# Patient Record
Sex: Female | Born: 1970 | Race: White | Hispanic: No | Marital: Married | Smoking: Never smoker
Health system: Southern US, Community
[De-identification: ages and names within clinical notes are randomized; demographics above are authoritative.]

## PROBLEM LIST (undated history)

## (undated) DIAGNOSIS — C439 Malignant melanoma of skin, unspecified: Secondary | ICD-10-CM

## (undated) DIAGNOSIS — C801 Malignant (primary) neoplasm, unspecified: Secondary | ICD-10-CM

## (undated) HISTORY — DX: Malignant melanoma of skin, unspecified: C43.9

## (undated) HISTORY — PX: WISDOM TOOTH EXTRACTION: SHX21

## (undated) HISTORY — DX: Malignant (primary) neoplasm, unspecified: C80.1

## (undated) HISTORY — PX: DILATION AND CURETTAGE OF UTERUS: SHX78

## (undated) HISTORY — PX: OTHER SURGICAL HISTORY: SHX169

---

## 1998-01-08 ENCOUNTER — Other Ambulatory Visit: Admission: RE | Admit: 1998-01-08 | Discharge: 1998-01-08 | Payer: Self-pay | Admitting: Obstetrics and Gynecology

## 1999-08-24 ENCOUNTER — Other Ambulatory Visit: Admission: RE | Admit: 1999-08-24 | Discharge: 1999-08-24 | Payer: Self-pay | Admitting: Obstetrics and Gynecology

## 2001-01-15 ENCOUNTER — Other Ambulatory Visit: Admission: RE | Admit: 2001-01-15 | Discharge: 2001-01-15 | Payer: Self-pay | Admitting: Obstetrics and Gynecology

## 2002-04-12 ENCOUNTER — Other Ambulatory Visit: Admission: RE | Admit: 2002-04-12 | Discharge: 2002-04-12 | Payer: Self-pay | Admitting: Obstetrics and Gynecology

## 2003-07-02 ENCOUNTER — Other Ambulatory Visit: Admission: RE | Admit: 2003-07-02 | Discharge: 2003-07-02 | Payer: Self-pay | Admitting: Obstetrics and Gynecology

## 2004-04-01 ENCOUNTER — Other Ambulatory Visit: Admission: RE | Admit: 2004-04-01 | Discharge: 2004-04-01 | Payer: Self-pay | Admitting: Obstetrics and Gynecology

## 2004-10-15 ENCOUNTER — Inpatient Hospital Stay (HOSPITAL_COMMUNITY): Admission: AD | Admit: 2004-10-15 | Discharge: 2004-10-17 | Payer: Self-pay | Admitting: Obstetrics and Gynecology

## 2004-11-25 ENCOUNTER — Other Ambulatory Visit: Admission: RE | Admit: 2004-11-25 | Discharge: 2004-11-25 | Payer: Self-pay | Admitting: Obstetrics and Gynecology

## 2008-07-02 ENCOUNTER — Encounter (INDEPENDENT_AMBULATORY_CARE_PROVIDER_SITE_OTHER): Payer: Self-pay | Admitting: Obstetrics and Gynecology

## 2008-07-02 ENCOUNTER — Ambulatory Visit (HOSPITAL_COMMUNITY): Admission: RE | Admit: 2008-07-02 | Discharge: 2008-07-02 | Payer: Self-pay | Admitting: Obstetrics and Gynecology

## 2009-10-20 ENCOUNTER — Inpatient Hospital Stay (HOSPITAL_COMMUNITY): Admission: AD | Admit: 2009-10-20 | Discharge: 2009-10-22 | Payer: Self-pay | Admitting: Obstetrics and Gynecology

## 2010-06-27 LAB — CBC
HCT: 32.3 % — ABNORMAL LOW (ref 36.0–46.0)
HCT: 37.2 % (ref 36.0–46.0)
Hemoglobin: 11.1 g/dL — ABNORMAL LOW (ref 12.0–15.0)
Hemoglobin: 12.5 g/dL (ref 12.0–15.0)
MCH: 30.5 pg (ref 26.0–34.0)
MCHC: 34.4 g/dL (ref 30.0–36.0)
MCV: 91.5 fL (ref 78.0–100.0)
RBC: 4.08 MIL/uL (ref 3.87–5.11)

## 2010-06-27 LAB — RPR: RPR Ser Ql: NONREACTIVE

## 2010-07-22 LAB — CBC
Hemoglobin: 12.7 g/dL (ref 12.0–15.0)
MCHC: 32.9 g/dL (ref 30.0–36.0)
MCV: 93.1 fL (ref 78.0–100.0)
RBC: 4.15 MIL/uL (ref 3.87–5.11)

## 2010-07-22 LAB — ABO/RH: ABO/RH(D): A POS

## 2010-08-24 NOTE — Op Note (Signed)
NAMEJOSSELYN, Brittany Ortega              ACCOUNT NO.:  [id number]   MEDICAL RECORD NO.:  [id number]          PATIENT TYPE:  AMB   LOCATION:  SDC                           FACILITY:  [facility]   PHYSICIAN:  Duke Salvia. Marcelle Overlie, M.D.DATE OF BIRTH:  05-Jan-1971   DATE OF PROCEDURE:  07/02/2008  DATE OF DISCHARGE:                               OPERATIVE REPORT   PREOPERATIVE DIAGNOSIS:  Missed abortion.   POSTOPERATIVE DIAGNOSIS:  Missed abortion.   PROCEDURE:  Dilatation and evacuation.   SURGEON:  Duke Salvia. Marcelle Overlie, MD   ANESTHESIA:  General.   COMPLICATIONS:  None.   DRAINS:  In-and-out Foley catheter.   BLOOD LOSS:  Minimal.   SPECIMENS REMOVED:  Products of conception.   PROCEDURE AND FINDINGS:  The patient was taken to the operating room.  After an adequate level of general anesthesia was obtained with the  patient's legs in stirrups, the lower abdomen, perineum, and vagina were  prepped and draped in usual manner for D and E.  Bladder was drained.  EUA carried out.  Uterus 8-week size, mid position.  Adnexa negative.  Speculum was positioned.  Cervix was grasped with a tenaculum.  Paracervical block was created by infiltrating at 3 and 9 o'clock  submucosally, 5-7 mL 1% Xylocaine on either side after negative  aspiration.  The uterus was then sounded to 8-9 cm, progressively  dilated to 27 Pratt.  A #7 curved suction curette was then used to  curette a moderate amount of tissue.  When no further tissue could be  removed, a small blunt curette was used to explore the cavity revealing  it to be clean.  There was minimal bleeding.  She tolerated this well,  went to recovery room in good condition.      Richard M. Marcelle Overlie, M.D.  Electronically Signed     RMH/MEDQ  D:  07/02/2008  T:  07/03/2008  Job:  [id number]

## 2010-08-24 NOTE — H&P (Signed)
NAMEMADINE, Brittany Ortega              ACCOUNT NO.:  [id number]   MEDICAL RECORD NO.:  [id number]          PATIENT TYPE:  AMB   LOCATION:                                FACILITY:  [facility]   PHYSICIAN:  Duke Salvia. Marcelle Overlie, M.D.DATE OF BIRTH:  1970-07-16   DATE OF ADMISSION:  07/02/2008  DATE OF DISCHARGE:                              HISTORY & PHYSICAL   DATE OF SCHEDULED SURGERY:  March 24.   CHIEF COMPLAINT:  Missed AB.   HISTORY OF PRESENT ILLNESS:  [age]-year-old G4, P2-0-1-2 was seen  yesterday for a new OB exam.  By good dates at 10 weeks.  Ultrasound  showed to be 7 weeks, well formed sac with a fetal pole, no fetal  movement, no FHR.  Her blood type is A+.  Presents now for D and E.  This procedure including risks related to bleeding, infection, other  complications that may require additional surgery such as perforation  were reviewed with her, which she understands and accepts.   PAST MEDICAL HISTORY:  Please see [place] form for detail.   ALLERGIES:  None.   OBSTETRICAL HISTORY:  Two vaginal deliveries at term.  December of 2009  had an early SAB that did not require D and E.   PHYSICAL EXAM:  Temperature 98.2, blood pressure 120/68.  HEENT:  Unremarkable.  NECK:  Supple without mass.  LUNGS:  Clear.  CARDIOVASCULAR:  Regular rate and rhythm without murmurs, rubs or  gallops.  BREASTS:  Without masses.  ABDOMEN:  Soft, flat, nontender.  PELVIC EXAM:  Normal external genitalia.  Vagina and cervix clear.  Uterus was 7-8 weeks' size, mid position.  Adnexa negative.  EXTREMITIES AND NEUROLOGIC:  Exam unremarkable.   IMPRESSION:  Missed abortion.   PLAN:  Dilation and evacuation procedure and risks reviewed as above.      Richard M. Marcelle Overlie, M.D.  Electronically Signed    RMH/MEDQ  D:  07/01/2008  T:  07/01/2008  Job:  [id number]

## 2010-08-27 NOTE — Op Note (Signed)
Brittany Ortega, KUSHNER              ACCOUNT NO.:  [id number]   MEDICAL RECORD NO.:  [id number]          PATIENT TYPE:  INP   LOCATION:  9143                          FACILITY:  [facility]   PHYSICIAN:  Guy Sandifer. Henderson Cloud, M.D. DATE OF BIRTH:  Jan 16, 1971   DATE OF PROCEDURE:  10/15/2004  DATE OF DISCHARGE:                                 OPERATIVE REPORT   PROCEDURE:  Vacuum extraction.   INDICATIONS AND CONSENT:  This patient is a 40 year old married white  female, G2, P8, with an EDC of October 23, 2004, who presents complaining of  spontaneous rupture of membranes for clear fluid and contractions beginning  at 10:30 a.m.  The patient has been pushing for approximately 1-1/2 to two  hours.  Vertex is at a +3 station.  The patient is tired and requests  assistance.  Vacuum extraction with one in 40,000 risk of severe morbidity  and mortality is discussed with the patient and her husband.  All questions  are answered, and the patient and husband request vacuum extraction.   PROCEDURE:  The Foley catheter had been removed less than 30 minutes prior.  The Kiwi vacuum extractor is placed and with a contraction, vacuum is  applied into the green on the handle.  Over the course of the single  contraction with a medium pull with no pop-offs, the vertex crowns up  nicely.  The vacuum extractor is removed and a second degree midline  episiotomy is performed.  The vertex then delivers.  A nuchal cord x1 is  reduced and the remainder of the baby is delivered without difficulty.  A  viable female infant, Apgars of 8 and 9 at one and five minutes, respectively,  is obtained.  Weight and arterial cord pH is pending at the time of  dictation.  Placenta is three vessels, intact.  Estimated blood loss is 400  mL.  Cervix and vagina without laceration.  A second degree midline  episiotomy is repaired.  The patient and infant are stable in the labor and  delivery room.       JET/MEDQ  D:  10/15/2004  T:   10/16/2004  Job:  [id number]

## 2011-03-30 DIAGNOSIS — Z0000 Encounter for general adult medical examination without abnormal findings: Secondary | ICD-10-CM | POA: Insufficient documentation

## 2011-11-17 ENCOUNTER — Other Ambulatory Visit: Payer: Self-pay | Admitting: Obstetrics and Gynecology

## 2011-11-17 DIAGNOSIS — Z1231 Encounter for screening mammogram for malignant neoplasm of breast: Secondary | ICD-10-CM

## 2011-12-23 ENCOUNTER — Ambulatory Visit: Payer: Self-pay

## 2012-01-20 ENCOUNTER — Ambulatory Visit
Admission: RE | Admit: 2012-01-20 | Discharge: 2012-01-20 | Disposition: A | Discharge status: Home / Self Care | Payer: BC Managed Care – PPO | Source: Ambulatory Visit | Attending: Obstetrics and Gynecology | Admitting: Obstetrics and Gynecology

## 2012-01-20 DIAGNOSIS — Z1231 Encounter for screening mammogram for malignant neoplasm of breast: Secondary | ICD-10-CM

## 2013-02-22 ENCOUNTER — Other Ambulatory Visit: Payer: Self-pay

## 2013-02-22 DIAGNOSIS — Z1231 Encounter for screening mammogram for malignant neoplasm of breast: Secondary | ICD-10-CM

## 2013-03-25 ENCOUNTER — Ambulatory Visit
Admission: RE | Admit: 2013-03-25 | Discharge: 2013-03-25 | Disposition: A | Discharge status: Home / Self Care | Payer: 59 | Source: Ambulatory Visit

## 2013-03-25 DIAGNOSIS — Z1231 Encounter for screening mammogram for malignant neoplasm of breast: Secondary | ICD-10-CM

## 2013-04-01 ENCOUNTER — Other Ambulatory Visit: Payer: Self-pay | Admitting: Obstetrics and Gynecology

## 2013-04-01 DIAGNOSIS — R928 Other abnormal and inconclusive findings on diagnostic imaging of breast: Secondary | ICD-10-CM

## 2013-04-05 ENCOUNTER — Ambulatory Visit
Admission: RE | Admit: 2013-04-05 | Discharge: 2013-04-05 | Disposition: A | Discharge status: Home / Self Care | Payer: 59 | Source: Ambulatory Visit | Attending: Obstetrics and Gynecology | Admitting: Obstetrics and Gynecology

## 2013-04-05 DIAGNOSIS — R928 Other abnormal and inconclusive findings on diagnostic imaging of breast: Secondary | ICD-10-CM

## 2014-03-25 ENCOUNTER — Other Ambulatory Visit: Payer: Self-pay | Admitting: Obstetrics and Gynecology

## 2014-03-26 LAB — CYTOLOGY - PAP

## 2014-04-14 ENCOUNTER — Other Ambulatory Visit: Payer: Self-pay

## 2014-04-14 DIAGNOSIS — Z1231 Encounter for screening mammogram for malignant neoplasm of breast: Secondary | ICD-10-CM

## 2014-04-18 ENCOUNTER — Ambulatory Visit
Admission: RE | Admit: 2014-04-18 | Discharge: 2014-04-18 | Disposition: A | Discharge status: Home / Self Care | Payer: 59 | Source: Ambulatory Visit

## 2014-04-18 DIAGNOSIS — Z1231 Encounter for screening mammogram for malignant neoplasm of breast: Secondary | ICD-10-CM

## 2014-04-28 ENCOUNTER — Ambulatory Visit (INDEPENDENT_AMBULATORY_CARE_PROVIDER_SITE_OTHER): Payer: 59 | Admitting: Internal Medicine

## 2014-04-28 ENCOUNTER — Encounter: Payer: Self-pay | Admitting: Internal Medicine

## 2014-04-28 VITALS — BP 128/72 | HR 88 | Resp 16 | Ht 61.0 in | Wt 165.0 lb

## 2014-04-28 DIAGNOSIS — R232 Flushing: Secondary | ICD-10-CM

## 2014-04-28 DIAGNOSIS — R200 Anesthesia of skin: Secondary | ICD-10-CM | POA: Insufficient documentation

## 2014-04-28 DIAGNOSIS — C439 Malignant melanoma of skin, unspecified: Secondary | ICD-10-CM

## 2014-04-28 DIAGNOSIS — R208 Other disturbances of skin sensation: Secondary | ICD-10-CM

## 2014-04-29 ENCOUNTER — Encounter: Payer: Self-pay | Admitting: *Deleted

## 2014-04-29 ENCOUNTER — Ambulatory Visit (HOSPITAL_BASED_OUTPATIENT_CLINIC_OR_DEPARTMENT_OTHER): Admission: RE | Admit: 2014-04-29 | Payer: 59 | Source: Ambulatory Visit

## 2014-04-29 NOTE — Progress Notes (Signed)
   Subjective:    Patient ID: Brittany Ortega, female    DOB: 12-11-1970, 44 y.o.   MRN: [id number]  HPI  Brittany Ortega is here for first visit primary care.  PMH of melanoma (dx left hand)  She is concerned over question of menopausal hot flushes.    She began 1 month ago with feeling of hot sensation across chest that radiated down both arms.  She has never had any diaphoresis.  Face went numb on Right side of face (happened while driving),  No  Visual or speech changes.  No muslce weakness no paresthesias.    She denies chest jpain or palpitations  No diarreha no rashes  [facility]  Imaging not done   Told labs were all  OK  No Known Allergies Past Medical History  Diagnosis Date  . Cancer     skin  . Melanoma    Past Surgical History  Procedure Laterality Date  . Wisdom tooth extraction    . Dilation and curettage of uterus     History   Social History  . Marital Status: Married    Spouse Name: N/A    Number of Children: N/A  . Years of Education: N/A   Occupational History  . Not on file.   Social History Main Topics  . Smoking status: Never Smoker   . Smokeless tobacco: Never Used  . Alcohol Use: No  . Drug Use: No  . Sexual Activity:    Partners: Male   Other Topics Concern  . Not on file   Social History Narrative  . No narrative on file   Family History  Problem Relation Age of Onset  . Breast cancer Mother   . Heart disease Maternal Grandfather   . Hypertension Paternal Grandmother   . Heart disease Paternal Grandfather    Patient Active Problem List   Diagnosis Date Noted  . Facial numbness 04/28/2014  . Melanoma of skin 04/28/2014   No current outpatient prescriptions on file prior to visit.   No current facility-administered medications on file prior to visit.      Review of Systems    see HPI Objective:   Physical Exam  Physical Exam  Nursing note and vitals reviewed.  Constitutional: She is oriented to  person, place, and time. She appears well-developed and well-nourished.  HENT:  Head: Normocephalic and atraumatic.  Cardiovascular: Normal rate and regular rhythm. Exam reveals no gallop and no friction rub.  No murmur heard.  Pulmonary/Chest: Breath sounds normal. She has no wheezes. She has no rales.  Neurological: She is alert and oriented to person, place, and time.  CN II-XII intact Reflexes 2+ symmetric Motor 5/5 UE and LE Sensory intact to microfilament Cerebellar intact FTN Skin: Skin is warm and dry.  Nor rash  Psychiatric: She has a normal mood and affect. Her behavior is normal.            Assessment & Plan:  Facial numbness with history of melanoma  Will get Head CT  EKG no acute changes  Sensation of heat  Doubt vasomotor menopausal flushing as she has never had and diaphoresis ftom this.  Will check thyroid, FSH and all labs  Follow up with me in 3-4 weeks

## 2014-05-19 ENCOUNTER — Ambulatory Visit: Payer: 59 | Admitting: Internal Medicine

## 2014-05-19 ENCOUNTER — Telehealth: Payer: Self-pay | Admitting: Internal Medicine

## 2014-05-19 NOTE — Telephone Encounter (Signed)
She cancelled the CT. She also had an appointment to follow up today for her labs however she cancelled that as well because she has not had the labs drawn yet. She said she plans to call back and reschedule-eh

## 2014-05-19 NOTE — Telephone Encounter (Signed)
Brittany Ortega  Call this pt and see if she went for her head CT that was ordered earlier this month as she had facial numbness.    She was also supposed to follow up with me and I do not see an OV  Route back with response    Thanks

## 2014-07-13 ENCOUNTER — Encounter: Payer: Self-pay | Admitting: Internal Medicine

## 2014-07-14 ENCOUNTER — Telehealth: Payer: Self-pay

## 2014-07-14 NOTE — Telephone Encounter (Signed)
Mailed

## 2014-09-01 ENCOUNTER — Encounter: Payer: Self-pay | Admitting: Family

## 2014-09-01 ENCOUNTER — Ambulatory Visit (INDEPENDENT_AMBULATORY_CARE_PROVIDER_SITE_OTHER): Payer: 59 | Admitting: Family

## 2014-09-01 VITALS — BP 122/82 | HR 106 | Temp 98.3°F | Resp 18 | Ht 61.5 in | Wt 161.6 lb

## 2014-09-01 DIAGNOSIS — R739 Hyperglycemia, unspecified: Secondary | ICD-10-CM | POA: Diagnosis not present

## 2014-09-01 DIAGNOSIS — J359 Chronic disease of tonsils and adenoids, unspecified: Secondary | ICD-10-CM | POA: Diagnosis not present

## 2014-09-01 DIAGNOSIS — N951 Menopausal and female climacteric states: Secondary | ICD-10-CM

## 2014-09-01 DIAGNOSIS — C439 Malignant melanoma of skin, unspecified: Secondary | ICD-10-CM | POA: Diagnosis not present

## 2014-09-01 DIAGNOSIS — R232 Flushing: Secondary | ICD-10-CM | POA: Insufficient documentation

## 2014-09-01 NOTE — Patient Instructions (Signed)
Please complete lab work prior to leaving. You will be contacted about your referral to ENT.  Please let us know if you have not heard back within 1 week about your referral. Schedule complete physical at the front desk. Welcome to [facility]!

## 2014-09-01 NOTE — Assessment & Plan Note (Signed)
Reviewed lab work from [facility] in care everywhere.  I doubt menopause. Suspect stress and anxiety are playing a role in her symptoms. However will check baseline hormonal studies.  At this point, I don't see a lot of value in having her complete a CT head as she has had no further neuro sxs.  However she understands that if she has recurrent numbness/weakness she should proceed to the ED.

## 2014-09-01 NOTE — Progress Notes (Signed)
Subjective:    Patient ID: Brittany Ortega, female    DOB: 13-Feb-1971, 44 y.o.   MRN: [id number]  HPI  Brittany Ortega is a 44 yr old female who presents today to establish care.  She had seen Dr. Lyndee Leo in the past.  She reports that one morning she was driving ot work.  Reports that she had a sensation of "internal heat." then developed right facial tingling.  Did not have CT scan but reports that her lab work was ok.  Did not follow through with CT scan or lab work that Dr. Lyndee Leo.  Reports that she feels the best during menses.  Notes 1-2 weeks prior to her menses she develops "morning sickness feelings" no recent   Pmhx is significant for melanoma of the left hand- pt reports that this was "in situ." This was diagnosed 04/2004- goes to derm annually.   Tonsilar lesion- has been there "for a long time." Non- painful.   Review of Systems  Constitutional: Negative for unexpected weight change.  HENT: Negative for hearing loss and rhinorrhea.   Eyes: Negative for visual disturbance.  Respiratory: Negative for cough.   Cardiovascular: Negative for leg swelling.  Gastrointestinal: Positive for constipation. Negative for nausea and diarrhea.       Mild constipation  Genitourinary: Negative for dysuria and frequency.  Musculoskeletal: Negative for myalgias and arthralgias.  Skin: Negative for rash.  Neurological: Negative for headaches.  Hematological: Negative for adenopathy.  Psychiatric/Behavioral: Negative for dysphoric mood and agitation.     Past Medical History  Diagnosis Date  . Cancer     skin  . Melanoma     History   Social History  . Marital Status: Married    Spouse Name: N/A  . Number of Children: N/A  . Years of Education: N/A   Occupational History  . Not on file.   Social History Main Topics  . Smoking status: Never Smoker   . Smokeless tobacco: Never Used  . Alcohol Use: No  . Drug Use: No  . Sexual Activity:    Partners: Male   Other Topics  Concern  . Not on file   Social History Narrative    Past Surgical History  Procedure Laterality Date  . Wisdom tooth extraction    . Dilation and curettage of uterus      Family History  Problem Relation Age of Onset  . Breast cancer Mother   . Heart disease Maternal Grandfather   . Hypertension Paternal Grandmother   . Heart disease Paternal Grandfather     No Known Allergies  No current outpatient prescriptions on file prior to visit.   No current facility-administered medications on file prior to visit.    BP 122/82 mmHg  Pulse 106  Temp(Src) 98.3 F (36.8 C) (Oral)  Resp 18  Ht 5' 1.5" (1.562 m)  Wt 161 lb 9.6 oz (73.301 kg)  BMI 30.04 kg/m2  SpO2 99%  LMP 08/21/2014       Objective:   Physical Exam  Constitutional: She is oriented to person, place, and time. She appears well-developed and well-nourished.  HENT:  Head: Normocephalic and atraumatic.  Right Ear: Tympanic membrane and ear canal normal.  Left Ear: Tympanic membrane and ear canal normal.  Mouth/Throat: Oropharynx is clear and moist. No oropharyngeal exudate.  Mass noted adjacent to left upper tonsil. approx 3 mm in diameter.   Eyes: No scleral icterus.  Neck: No thyromegaly present.  Cardiovascular: Normal rate, regular rhythm and  normal heart sounds.   No murmur heard. Pulmonary/Chest: Effort normal and breath sounds normal. No respiratory distress. She has no wheezes.  Musculoskeletal: She exhibits no edema.  Lymphadenopathy:    She has no cervical adenopathy.  Neurological: She is alert and oriented to person, place, and time.  Skin: Skin is warm and dry.  Psychiatric: She has a normal mood and affect. Her behavior is normal. Judgment and thought content normal.          Assessment & Plan:

## 2014-09-01 NOTE — Progress Notes (Signed)
Pre visit review using our clinic review tool, if applicable. No additional management support is needed unless otherwise documented below in the visit note. 

## 2014-09-01 NOTE — Assessment & Plan Note (Signed)
Advised pt to continue annual follow up with dermatology.

## 2014-09-01 NOTE — Assessment & Plan Note (Signed)
Will refer to ENT for further evaluation.

## 2014-09-02 LAB — HEMOGLOBIN A1C: Hgb A1c MFr Bld: 5.2 % (ref 4.6–6.5)

## 2014-09-02 LAB — LUTEINIZING HORMONE: LH: 8.1 m[IU]/mL

## 2014-09-02 LAB — FOLLICLE STIMULATING HORMONE: FSH: 6.9 m[IU]/mL

## 2014-09-02 LAB — T3, FREE: T3 FREE: 2.9 pg/mL (ref 2.3–4.2)

## 2014-09-02 LAB — T4, FREE: FREE T4: 0.75 ng/dL (ref 0.60–1.60)

## 2014-09-02 LAB — TSH: TSH: 1.39 u[IU]/mL (ref 0.35–4.50)

## 2014-09-03 ENCOUNTER — Encounter: Payer: Self-pay | Admitting: Family

## 2014-09-03 LAB — ESTRADIOL: Estradiol: 122.2 pg/mL

## 2014-10-14 ENCOUNTER — Telehealth: Payer: Self-pay | Admitting: Family

## 2014-10-14 NOTE — Telephone Encounter (Signed)
pre visit letter mailed 10/14/14 °

## 2014-11-03 ENCOUNTER — Encounter: Payer: 59 | Admitting: Family

## 2015-04-27 ENCOUNTER — Other Ambulatory Visit: Payer: Self-pay

## 2015-04-27 DIAGNOSIS — Z1231 Encounter for screening mammogram for malignant neoplasm of breast: Secondary | ICD-10-CM

## 2015-04-29 ENCOUNTER — Ambulatory Visit: Payer: 59

## 2015-05-20 ENCOUNTER — Ambulatory Visit
Admission: RE | Admit: 2015-05-20 | Discharge: 2015-05-20 | Disposition: A | Discharge status: Home / Self Care | Payer: 59 | Source: Ambulatory Visit

## 2015-05-20 DIAGNOSIS — Z1231 Encounter for screening mammogram for malignant neoplasm of breast: Secondary | ICD-10-CM

## 2015-12-03 ENCOUNTER — Other Ambulatory Visit: Payer: Self-pay | Admitting: General Surgery

## 2016-05-27 ENCOUNTER — Other Ambulatory Visit: Payer: Self-pay | Admitting: Obstetrics and Gynecology

## 2016-05-27 DIAGNOSIS — Z1231 Encounter for screening mammogram for malignant neoplasm of breast: Secondary | ICD-10-CM

## 2016-06-10 ENCOUNTER — Ambulatory Visit
Admission: RE | Admit: 2016-06-10 | Discharge: 2016-06-10 | Disposition: A | Discharge status: Home / Self Care | Payer: 59 | Source: Ambulatory Visit | Attending: Obstetrics and Gynecology | Admitting: Obstetrics and Gynecology

## 2016-06-10 DIAGNOSIS — Z1231 Encounter for screening mammogram for malignant neoplasm of breast: Secondary | ICD-10-CM

## 2017-07-11 ENCOUNTER — Other Ambulatory Visit: Payer: Self-pay | Admitting: Obstetrics and Gynecology

## 2017-07-11 DIAGNOSIS — Z1231 Encounter for screening mammogram for malignant neoplasm of breast: Secondary | ICD-10-CM

## 2017-07-31 ENCOUNTER — Ambulatory Visit
Admission: RE | Admit: 2017-07-31 | Discharge: 2017-07-31 | Disposition: A | Discharge status: Home / Self Care | Payer: 59 | Source: Ambulatory Visit | Attending: Obstetrics and Gynecology | Admitting: Obstetrics and Gynecology

## 2017-07-31 DIAGNOSIS — Z1231 Encounter for screening mammogram for malignant neoplasm of breast: Secondary | ICD-10-CM

## 2019-05-13 ENCOUNTER — Other Ambulatory Visit: Payer: Self-pay | Admitting: Obstetrics and Gynecology

## 2019-05-13 DIAGNOSIS — Z1231 Encounter for screening mammogram for malignant neoplasm of breast: Secondary | ICD-10-CM

## 2019-06-19 ENCOUNTER — Other Ambulatory Visit: Payer: Self-pay

## 2019-06-19 ENCOUNTER — Ambulatory Visit
Admission: RE | Admit: 2019-06-19 | Discharge: 2019-06-19 | Disposition: A | Discharge status: Home / Self Care | Payer: 59 | Source: Ambulatory Visit | Attending: Obstetrics and Gynecology | Admitting: Obstetrics and Gynecology

## 2019-06-19 ENCOUNTER — Other Ambulatory Visit: Payer: Self-pay | Admitting: Obstetrics and Gynecology

## 2019-06-19 DIAGNOSIS — R928 Other abnormal and inconclusive findings on diagnostic imaging of breast: Secondary | ICD-10-CM

## 2019-06-19 DIAGNOSIS — Z1231 Encounter for screening mammogram for malignant neoplasm of breast: Secondary | ICD-10-CM

## 2019-06-27 ENCOUNTER — Ambulatory Visit
Admission: RE | Admit: 2019-06-27 | Discharge: 2019-06-27 | Disposition: A | Discharge status: Home / Self Care | Payer: 59 | Source: Ambulatory Visit | Attending: Obstetrics and Gynecology | Admitting: Obstetrics and Gynecology

## 2019-06-27 ENCOUNTER — Other Ambulatory Visit: Payer: Self-pay

## 2019-06-27 DIAGNOSIS — R928 Other abnormal and inconclusive findings on diagnostic imaging of breast: Secondary | ICD-10-CM

## 2020-06-11 ENCOUNTER — Other Ambulatory Visit: Payer: Self-pay | Admitting: Obstetrics and Gynecology

## 2020-06-11 DIAGNOSIS — Z1231 Encounter for screening mammogram for malignant neoplasm of breast: Secondary | ICD-10-CM

## 2020-08-04 ENCOUNTER — Ambulatory Visit
Admission: RE | Admit: 2020-08-04 | Discharge: 2020-08-04 | Disposition: A | Discharge status: Home / Self Care | Payer: 59 | Source: Ambulatory Visit | Attending: Obstetrics and Gynecology | Admitting: Obstetrics and Gynecology

## 2020-08-04 ENCOUNTER — Other Ambulatory Visit: Payer: Self-pay

## 2020-08-04 DIAGNOSIS — Z1231 Encounter for screening mammogram for malignant neoplasm of breast: Secondary | ICD-10-CM

## 2021-01-20 DIAGNOSIS — M722 Plantar fascial fibromatosis: Secondary | ICD-10-CM | POA: Insufficient documentation

## 2021-01-20 DIAGNOSIS — M79671 Pain in right foot: Secondary | ICD-10-CM | POA: Insufficient documentation

## 2021-06-11 ENCOUNTER — Other Ambulatory Visit: Payer: Self-pay | Admitting: Obstetrics and Gynecology

## 2021-06-11 DIAGNOSIS — Z1231 Encounter for screening mammogram for malignant neoplasm of breast: Secondary | ICD-10-CM

## 2021-08-05 ENCOUNTER — Ambulatory Visit
Admission: RE | Admit: 2021-08-05 | Discharge: 2021-08-05 | Disposition: A | Discharge status: Home / Self Care | Payer: 59 | Source: Ambulatory Visit | Attending: Obstetrics and Gynecology | Admitting: Obstetrics and Gynecology

## 2021-08-05 DIAGNOSIS — Z1231 Encounter for screening mammogram for malignant neoplasm of breast: Secondary | ICD-10-CM

## 2022-05-10 ENCOUNTER — Encounter: Payer: Self-pay | Admitting: Gastroenterology

## 2022-06-10 ENCOUNTER — Encounter: Payer: Self-pay | Admitting: Gastroenterology

## 2022-06-10 ENCOUNTER — Ambulatory Visit (AMBULATORY_SURGERY_CENTER): Payer: 59

## 2022-06-10 VITALS — Ht 61.0 in | Wt 164.0 lb

## 2022-06-10 DIAGNOSIS — E669 Obesity, unspecified: Secondary | ICD-10-CM | POA: Insufficient documentation

## 2022-06-10 DIAGNOSIS — Z1211 Encounter for screening for malignant neoplasm of colon: Secondary | ICD-10-CM

## 2022-06-10 MED ORDER — NA SULFATE-K SULFATE-MG SULF 17.5-3.13-1.6 GM/177ML PO SOLN: 1.0000 | Freq: Once | ORAL | 0 refills | Status: AC

## 2022-06-10 NOTE — Progress Notes (Signed)

## 2022-06-24 ENCOUNTER — Ambulatory Visit (AMBULATORY_SURGERY_CENTER): Payer: 59 | Admitting: Gastroenterology

## 2022-06-24 ENCOUNTER — Encounter: Payer: Self-pay | Admitting: Gastroenterology

## 2022-06-24 VITALS — BP 114/61 | HR 55 | Temp 99.3°F | Resp 16 | Ht 61.0 in | Wt 162.4 lb

## 2022-06-24 DIAGNOSIS — D124 Benign neoplasm of descending colon: Secondary | ICD-10-CM

## 2022-06-24 DIAGNOSIS — Z1211 Encounter for screening for malignant neoplasm of colon: Secondary | ICD-10-CM

## 2022-06-24 MED ORDER — SODIUM CHLORIDE 0.9 % IV SOLN: 500.0000 mL | INTRAVENOUS | Status: DC

## 2022-06-24 NOTE — Progress Notes (Signed)
Sedate, gd SR, tolerated procedure well, VSS, report to RN 

## 2022-06-24 NOTE — Op Note (Signed)
[facility] Patient Name: Brittany Ortega Procedure Date: 06/24/2022 10:57 AM MRN: [id number] Endoscopist: Sully. Loletha Carrow , MD, [id number] Age: 52 Referring MD:  Date of Birth: 1970-10-15 Gender: Female Account #: [id number] Procedure:                Colonoscopy Indications:              Screening for colorectal malignant neoplasm, This                            is the patient's first colonoscopy Medicines:                Monitored Anesthesia Care Procedure:                Pre-Anesthesia Assessment:                           - Prior to the procedure, a History and Physical                            was performed, and patient medications and                            allergies were reviewed. The patient's tolerance of                            previous anesthesia was also reviewed. The risks                            and benefits of the procedure and the sedation                            options and risks were discussed with the patient.                            All questions were answered, and informed consent                            was obtained. Prior Anticoagulants: The patient has                            taken no anticoagulant or antiplatelet agents. ASA                            Grade Assessment: I - A normal, healthy patient.                            After reviewing the risks and benefits, the patient                            was deemed in satisfactory condition to undergo the                            procedure.  After obtaining informed consent, the colonoscope                            was passed under direct vision. Throughout the                            procedure, the patient's blood pressure, pulse, and                            oxygen saturations were monitored continuously. The                            Olympus CF-HQ190L ([id number]) Colonoscope was                            introduced through the anus and  advanced to the the                            cecum, identified by appendiceal orifice and                            ileocecal valve. The colonoscopy was performed                            without difficulty. The patient tolerated the                            procedure well. The quality of the bowel                            preparation was excellent. The ileocecal valve,                            appendiceal orifice, and rectum were photographed.                            The bowel preparation used was SUPREP via split                            dose instruction. Scope In: 11:11:53 AM Scope Out: 11:27:34 AM Scope Withdrawal Time: 0 hours 10 minutes 59 seconds  Total Procedure Duration: 0 hours 15 minutes 41 seconds  Findings:                 Grade 2 Internal hemorrhoids were found on perianal                            exam.                           Repeat examination of right colon under NBI                            performed.  A 6-8 mm polyp was found in the descending colon.                            The polyp was sessile. The polyp was removed with a                            cold snare. Resection and retrieval were complete.                           A few diverticula were found in the left colon.                           Internal hemorrhoids were found. The hemorrhoids                            were Grade II (internal hemorrhoids that prolapse                            but reduce spontaneously).                           The exam was otherwise without abnormality on                            direct and retroflexion views. Complications:            No immediate complications. Estimated Blood Loss:     Estimated blood loss was minimal. Impression:               - Hemorrhoids found on perianal exam.                           - One 6-8 mm polyp in the descending colon, removed                            with a cold snare. Resected and  retrieved.                           - Diverticulosis in the left colon.                           - Internal hemorrhoids.                           - The examination was otherwise normal on direct                            and retroflexion views. Recommendation:           - Patient has a contact number available for                            emergencies. The signs and symptoms of potential  delayed complications were discussed with the                            patient. Return to normal activities tomorrow.                            Written discharge instructions were provided to the                            patient.                           - Resume previous diet.                           - Continue present medications.                           - Await pathology results.                           - Repeat colonoscopy is recommended for                            surveillance. The colonoscopy date will be                            determined after pathology results from today's                            exam become available for review.                           - Return to my office if hemorrhoidal banding                            desired. Narciso Stoutenburg L. Loletha Carrow, MD 06/24/2022 11:32:28 AM This report has been signed electronically.

## 2022-06-24 NOTE — Patient Instructions (Signed)
Handouts Provided:  Polyps, Diverticulosis and Hemorrhoid Banding  YOU HAD AN ENDOSCOPIC PROCEDURE TODAY AT THE  ENDOSCOPY CENTER:   Refer to the procedure report that was given to you for any specific questions about what was found during the examination.  If the procedure report does not answer your questions, please call your gastroenterologist to clarify.  If you requested that your care partner not be given the details of your procedure findings, then the procedure report has been included in a sealed envelope for you to review at your convenience later.  YOU SHOULD EXPECT: Some feelings of bloating in the abdomen. Passage of more gas than usual.  Walking can help get rid of the air that was put into your GI tract during the procedure and reduce the bloating. If you had a lower endoscopy (such as a colonoscopy or flexible sigmoidoscopy) you may notice spotting of blood in your stool or on the toilet paper. If you underwent a bowel prep for your procedure, you may not have a normal bowel movement for a few days.  Please Note:  You might notice some irritation and congestion in your nose or some drainage.  This is from the oxygen used during your procedure.  There is no need for concern and it should clear up in a day or so.  SYMPTOMS TO REPORT IMMEDIATELY:  Following lower endoscopy (colonoscopy or flexible sigmoidoscopy):  Excessive amounts of blood in the stool  Significant tenderness or worsening of abdominal pains  Swelling of the abdomen that is new, acute  Fever of 100F or higher  For urgent or emergent issues, a gastroenterologist can be reached at any hour by calling [phone]. Do not use MyChart messaging for urgent concerns.    DIET:  We do recommend a small meal at first, but then you may proceed to your regular diet.  Drink plenty of fluids but you should avoid alcoholic beverages for 24 hours.  ACTIVITY:  You should plan to take it easy for the rest of today and  you should NOT DRIVE or use heavy machinery until tomorrow (because of the sedation medicines used during the test).    FOLLOW UP: Our staff will call the number listed on your records the next business day following your procedure.  We will call around 7:15- 8:00 am to check on you and address any questions or concerns that you may have regarding the information given to you following your procedure. If we do not reach you, we will leave a message.     If any biopsies were taken you will be contacted by phone or by letter within the next 1-3 weeks.  Please call us at [phone] if you have not heard about the biopsies in 3 weeks.    SIGNATURES/CONFIDENTIALITY: You and/or your care partner have signed paperwork which will be entered into your electronic medical record.  These signatures attest to the fact that that the information above on your After Visit Summary has been reviewed and is understood.  Full responsibility of the confidentiality of this discharge information lies with you and/or your care-partner.

## 2022-06-24 NOTE — Progress Notes (Signed)
Called to room to assist during endoscopic procedure.  Patient ID and intended procedure confirmed with present staff. Received instructions for my participation in the procedure from the performing physician.  

## 2022-06-24 NOTE — Progress Notes (Signed)
History and Physical:  This patient presents for endoscopic testing for: Encounter Diagnosis  Name Primary?   Special screening for malignant neoplasms, colon Yes    Average risk - first screening exam Patient denies chronic abdominal pain, constipation or diarrhea. Periodic hemorrhoidal bleeding  Patient is otherwise without complaints or active issues today.   Past Medical History: Past Medical History:  Diagnosis Date   Cancer ([place])    skin   Melanoma ([place])      Past Surgical History: Past Surgical History:  Procedure Laterality Date   DILATION AND CURETTAGE OF UTERUS     Melanoma resection Left    hand   WISDOM TOOTH EXTRACTION      Allergies: No Known Allergies  Outpatient Meds: Current Outpatient Medications  Medication Sig Dispense Refill   Pramoxine-HC (HYDROCORTISONE ACE-PRAMOXINE) 2.5-1 % CREA Place 1 Application rectally as needed (Hemorrhoids).     Current Facility-Administered Medications  Medication Dose Route Frequency Provider Last Rate Last Admin   0.9 %  sodium chloride infusion  500 mL Intravenous Continuous Danis, Estill Cotta III, MD          ___________________________________________________________________ Objective   Exam:  BP 120/74   Pulse 81   Temp 99.3 F (37.4 C) (Temporal)   Ht 5\' 1"  (1.549 m)   Wt 162 lb 6.4 oz (73.7 kg)   SpO2 100%   BMI 30.69 kg/m   CV: regular , S1/S2 Resp: clear to auscultation bilaterally, normal RR and effort noted GI: soft, no tenderness, with active bowel sounds.   Assessment: Encounter Diagnosis  Name Primary?   Special screening for malignant neoplasms, colon Yes     Plan: Colonoscopy  The benefits and risks of the planned procedure were described in detail with the patient or (when appropriate) their health care proxy.  Risks were outlined as including, but not limited to, bleeding, infection, perforation, adverse medication reaction leading to cardiac or pulmonary decompensation,  pancreatitis (if ERCP).  The limitation of incomplete mucosal visualization was also discussed.  No guarantees or warranties were given.    The patient is appropriate for an endoscopic procedure in the ambulatory setting.   - Wilfrid Lund, MD

## 2022-06-24 NOTE — Progress Notes (Signed)
VS completed by DT.  Pt's states no medical or surgical changes since previsit or office visit.  

## 2022-06-27 ENCOUNTER — Telehealth: Payer: Self-pay

## 2022-06-27 NOTE — Telephone Encounter (Signed)
  Follow up Call-     06/24/2022   10:48 AM  Call back number  Post procedure Call Back phone  # [phone]  Permission to leave phone message Yes     Patient questions:  Do you have a fever, pain , or abdominal swelling? No. Pain Score  0 *  Have you tolerated food without any problems? Yes.    Have you been able to return to your normal activities? Yes.    Do you have any questions about your discharge instructions: Diet   No. Medications  No. Follow up visit  No.  Do you have questions or concerns about your Care? No.  Actions: * If pain score is 4 or above: No action needed, pain <4.

## 2022-06-28 ENCOUNTER — Encounter: Payer: Self-pay | Admitting: Gastroenterology

## 2022-07-01 ENCOUNTER — Telehealth: Payer: Self-pay | Admitting: Gastroenterology

## 2022-07-01 NOTE — Telephone Encounter (Signed)
Called and spoke with patient. She states that she is able to swallow, eat, and drink without any difficulty. Pt states that it feels like something small and sharp is lodged in her throat. I explained to patient that this is not related to colonoscopy, we see more throat irritation with EGD. Pt did not eat any new foods, she will reach out to PCP for further evaluation. Pt had no concerns at the end of the call.

## 2022-07-01 NOTE — Telephone Encounter (Signed)
Inbound call from patient, states she is having throat irritation that started after her procedure. She would like to speak with a nurse in regards to if it has any correlation.

## 2022-10-12 ENCOUNTER — Other Ambulatory Visit: Payer: Self-pay | Admitting: Obstetrics and Gynecology

## 2022-10-12 DIAGNOSIS — Z1231 Encounter for screening mammogram for malignant neoplasm of breast: Secondary | ICD-10-CM

## 2022-11-24 ENCOUNTER — Ambulatory Visit
Admission: RE | Admit: 2022-11-24 | Discharge: 2022-11-24 | Disposition: A | Discharge status: Home / Self Care | Payer: 59 | Source: Ambulatory Visit | Attending: Obstetrics and Gynecology | Admitting: Obstetrics and Gynecology

## 2022-11-24 DIAGNOSIS — Z1231 Encounter for screening mammogram for malignant neoplasm of breast: Secondary | ICD-10-CM

## 2023-02-27 ENCOUNTER — Other Ambulatory Visit: Payer: Self-pay | Admitting: Obstetrics and Gynecology

## 2023-02-27 DIAGNOSIS — N6310 Unspecified lump in the right breast, unspecified quadrant: Secondary | ICD-10-CM

## 2023-03-16 ENCOUNTER — Ambulatory Visit
Admission: RE | Admit: 2023-03-16 | Discharge: 2023-03-16 | Disposition: A | Discharge status: Home / Self Care | Payer: 59 | Source: Ambulatory Visit | Attending: Obstetrics and Gynecology | Admitting: Obstetrics and Gynecology

## 2023-03-16 DIAGNOSIS — N6310 Unspecified lump in the right breast, unspecified quadrant: Secondary | ICD-10-CM

## 2023-09-25 IMAGING — MG MM DIGITAL SCREENING BILAT W/ TOMO AND CAD
8 series · 8 of 24 positions shown · non-contrast
Comparison: Previous exam(s).

CLINICAL DATA: Screening.

EXAM:
DIGITAL SCREENING BILATERAL MAMMOGRAM WITH TOMOSYNTHESIS AND CAD
TECHNIQUE: Bilateral screening digital craniocaudal and mediolateral oblique
mammograms were obtained. Bilateral screening digital breast
tomosynthesis was performed. The images were evaluated with
computer-aided detection.

[L MLO synth-2D]
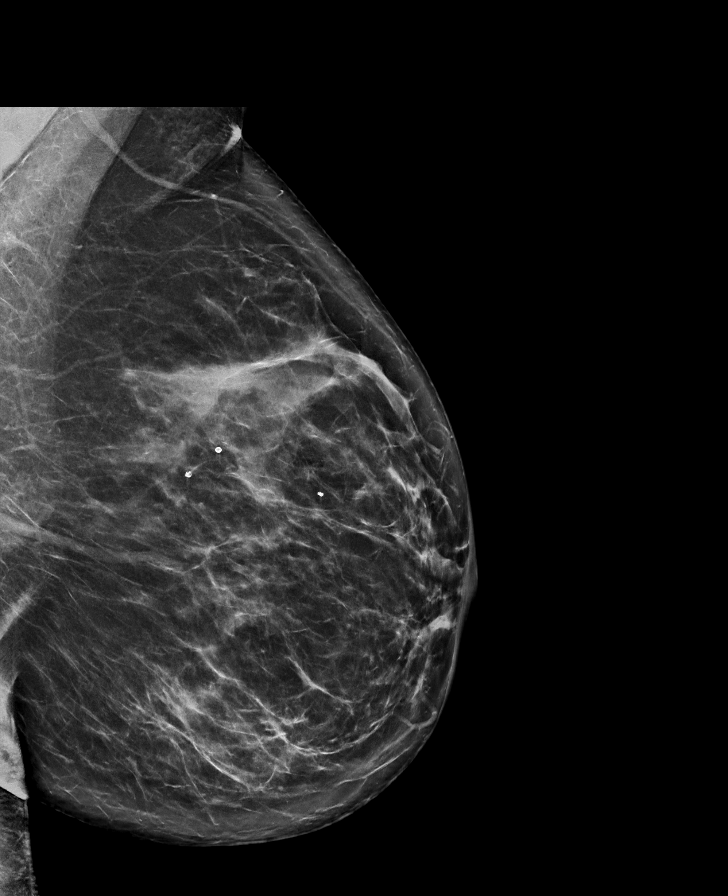

[R MLO synth-2D]
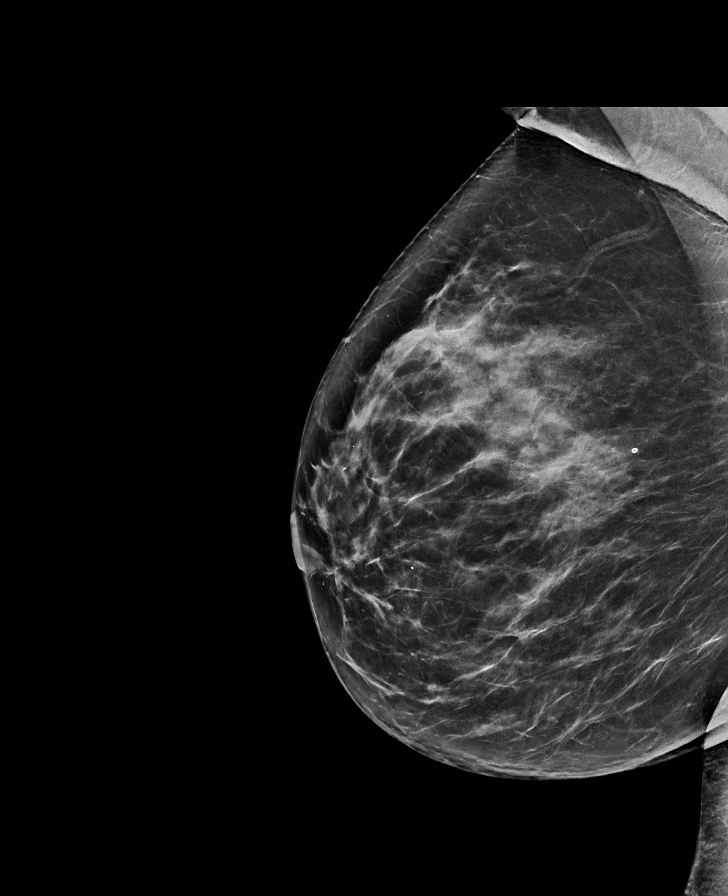

[R CC synth-2D]
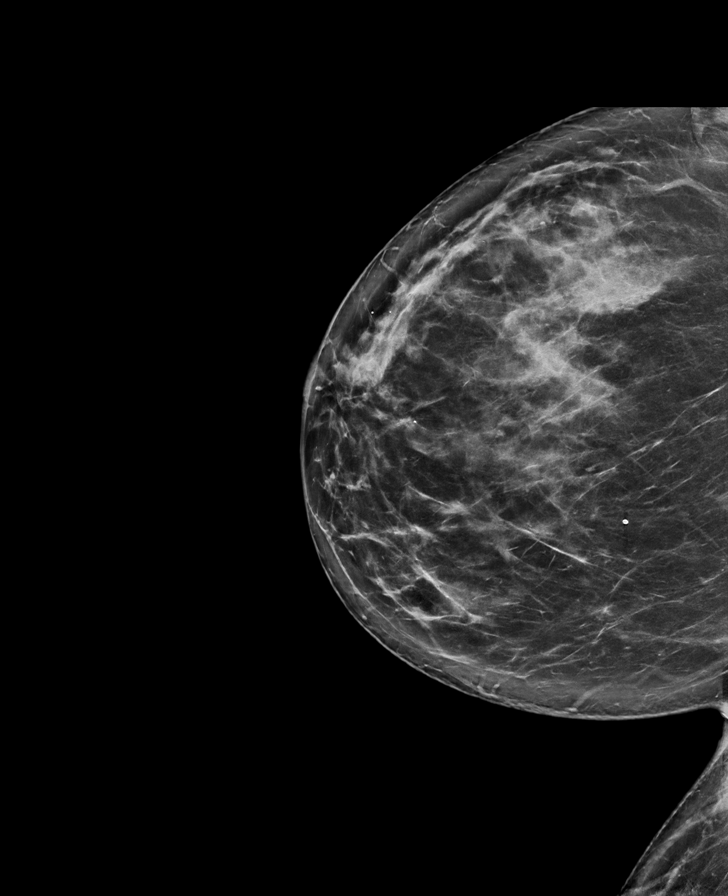

[L CC synth-2D]
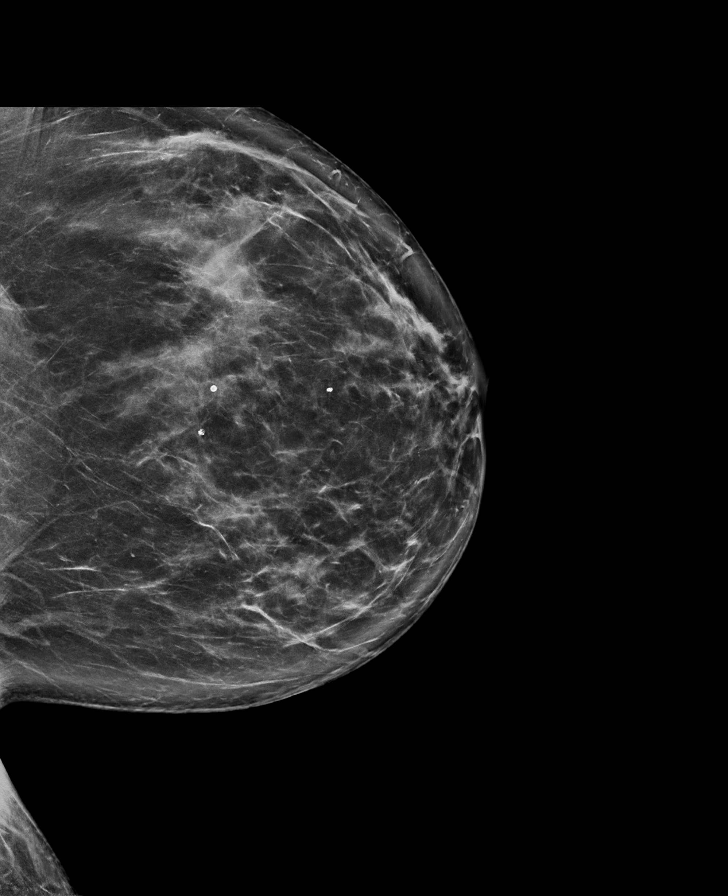

[R CC tomo · tomo slice 45/88.0]
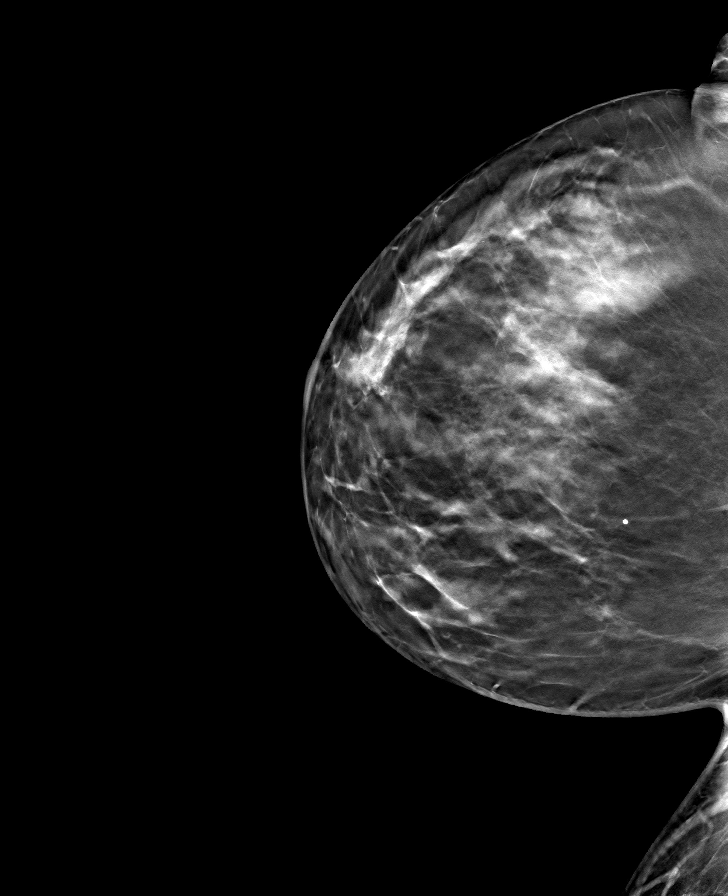

[L MLO tomo · tomo slice 43/86.0]
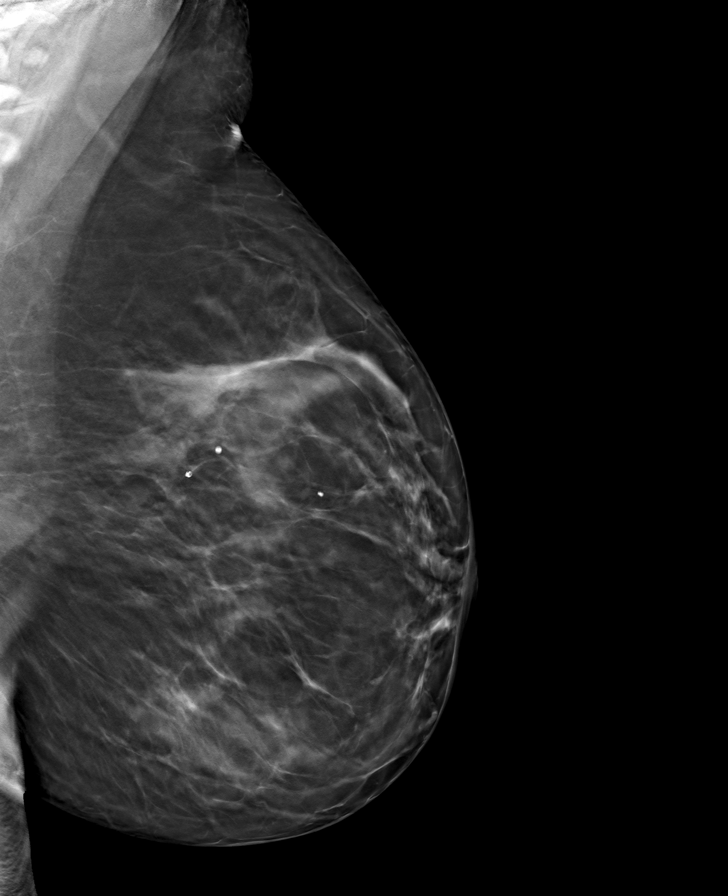

[R MLO tomo · tomo slice 44/87.0]
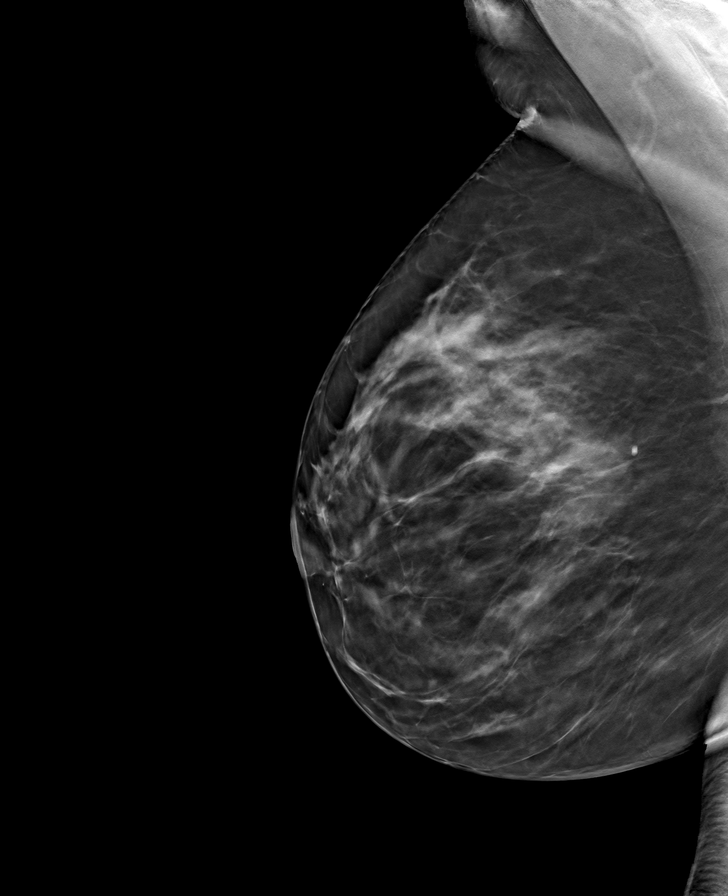

[L CC tomo · tomo slice 44/87.0]
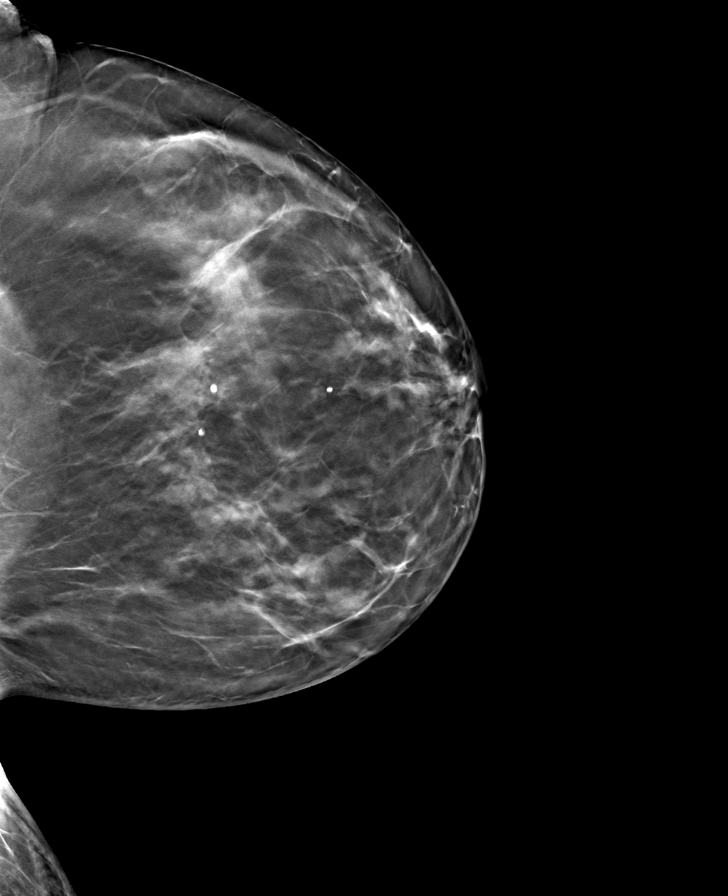

[8 of 24 positions shown; findings below may reference images not displayed]

ACR Breast Density Category c: The breast tissue is heterogeneously
dense, which may obscure small masses.
FINDINGS: There are no findings suspicious for malignancy.
IMPRESSION: No mammographic evidence of malignancy. A result letter of this
screening mammogram will be mailed directly to the patient.

RECOMMENDATION:
Screening mammogram in one year. (Code:Q3-W-BC3)

BI-RADS CATEGORY  1: Negative.

## 2023-11-15 ENCOUNTER — Other Ambulatory Visit: Payer: Self-pay | Admitting: Obstetrics and Gynecology

## 2023-11-15 DIAGNOSIS — Z1231 Encounter for screening mammogram for malignant neoplasm of breast: Secondary | ICD-10-CM

## 2023-11-30 ENCOUNTER — Ambulatory Visit

## 2023-12-15 ENCOUNTER — Ambulatory Visit
Admission: RE | Admit: 2023-12-15 | Discharge: 2023-12-15 | Disposition: A | Discharge status: Home / Self Care | Source: Ambulatory Visit | Attending: Obstetrics and Gynecology | Admitting: Obstetrics and Gynecology

## 2023-12-15 DIAGNOSIS — Z1231 Encounter for screening mammogram for malignant neoplasm of breast: Secondary | ICD-10-CM
